# Patient Record
Sex: Male | Born: 1960 | Hispanic: Yes | Marital: Married | Smoking: Never smoker
Health system: Southern US, Community
[De-identification: ages and names within clinical notes are randomized; demographics above are authoritative.]

## PROBLEM LIST (undated history)

## (undated) DIAGNOSIS — R7303 Prediabetes: Secondary | ICD-10-CM

## (undated) HISTORY — PX: NO PAST SURGERIES: SHX2092

---

## 2004-07-09 ENCOUNTER — Emergency Department: Payer: Self-pay | Admitting: Emergency Medicine

## 2011-05-12 ENCOUNTER — Ambulatory Visit: Payer: Self-pay | Admitting: Family Medicine

## 2011-06-08 ENCOUNTER — Ambulatory Visit: Payer: Self-pay | Admitting: Internal Medicine

## 2017-03-22 ENCOUNTER — Ambulatory Visit (INDEPENDENT_AMBULATORY_CARE_PROVIDER_SITE_OTHER)
Admission: RE | Admit: 2017-03-22 | Discharge: 2017-03-22 | Disposition: A | Discharge status: Home / Self Care | Payer: BLUE CROSS/BLUE SHIELD | Source: Ambulatory Visit | Attending: Internal Medicine | Admitting: Internal Medicine

## 2017-03-22 ENCOUNTER — Encounter: Payer: Self-pay | Admitting: Internal Medicine

## 2017-03-22 ENCOUNTER — Ambulatory Visit (INDEPENDENT_AMBULATORY_CARE_PROVIDER_SITE_OTHER): Payer: BLUE CROSS/BLUE SHIELD | Admitting: Internal Medicine

## 2017-03-22 VITALS — BP 118/80 | HR 58 | Temp 98.4°F | Wt 164.0 lb

## 2017-03-22 DIAGNOSIS — R0602 Shortness of breath: Secondary | ICD-10-CM

## 2017-03-22 LAB — COMPREHENSIVE METABOLIC PANEL
ALT: 26 U/L (ref 0–53)
AST: 23 U/L (ref 0–37)
Albumin: 3.9 g/dL (ref 3.5–5.2)
Alkaline Phosphatase: 51 U/L (ref 39–117)
BUN: 19 mg/dL (ref 6–23)
CHLORIDE: 107 meq/L (ref 96–112)
CO2: 30 meq/L (ref 19–32)
Calcium: 9.2 mg/dL (ref 8.4–10.5)
Creatinine, Ser: 0.78 mg/dL (ref 0.40–1.50)
GFR: 108.92 mL/min (ref >60.00)
Glucose, Bld: 107 mg/dL — ABNORMAL HIGH (ref 70–99)
POTASSIUM: 4.3 meq/L (ref 3.5–5.1)
Sodium: 140 mEq/L (ref 135–145)
TOTAL PROTEIN: 7.1 g/dL (ref 6.0–8.3)
Total Bilirubin: 0.4 mg/dL (ref 0.2–1.2)

## 2017-03-22 LAB — LIPID PANEL
CHOLESTEROL: 167 mg/dL (ref 0–200)
HDL: 43.8 mg/dL (ref >39.00)
LDL CALC: 108 mg/dL — AB (ref 0–99)
NonHDL: 123.23
TRIGLYCERIDES: 77 mg/dL (ref 0.0–149.0)
Total CHOL/HDL Ratio: 4
VLDL: 15.4 mg/dL (ref 0.0–40.0)

## 2017-03-22 LAB — CBC
HEMATOCRIT: 43.1 % (ref 39.0–52.0)
Hemoglobin: 14.5 g/dL (ref 13.0–17.0)
MCHC: 33.7 g/dL (ref 30.0–36.0)
MCV: 89.1 fl (ref 78.0–100.0)
Platelets: 272 10*3/uL (ref 150.0–400.0)
RBC: 4.84 Mil/uL (ref 4.22–5.81)
RDW: 13 % (ref 11.5–15.5)
WBC: 6.8 10*3/uL (ref 4.0–10.5)

## 2017-03-22 NOTE — Patient Instructions (Signed)
Falta de aire (Shortness of Breath) Falta de aire significa que tiene dificultad para respirar. Es necesario que reciba atencin mdica de inmediato. CUIDADOS EN EL HOGAR  No fume.  Evite estar cerca de sustancias qumicas (vapores de pintura, polvo) que puedan dificultar su respiracin.  Descanse todo lo que sea necesario. Retome lentamente sus actividades normales.  Tome solo los medicamentos segn le haya indicado el mdico.  Cumpla con las visitas al mdico segn las indicaciones.  SOLICITE AYUDA DE INMEDIATO SI:  La falta de aire empeora.  Tiene mareos, pierde el conocimiento (se desmaya) o tiene tos que no mejora con medicamentos.  Tose y escupe sangre.  Siente dolor al respirar.  Tiene dolor en el pecho, los brazos, los hombros o el vientre (abdomen).  Tiene fiebre.  No puede subir escaleras o realizar ejercicio del modo en que lo haca habitualmente.  No mejora como se esperaba.  Le cuesta hacer las actividades normales, aun si ha descansado lo suficiente.  Tiene problemas con los medicamentos.  Aparece algn sntoma nuevo.  ASEGRESE DE QUE:  Comprende estas instrucciones.  Controlar su afeccin.  Recibir ayuda de inmediato si no mejora o si empeora.  Esta informacin no tiene Theme park manager el consejo del mdico. Asegrese de hacerle al mdico cualquier pregunta que tenga. Document Released: 12/30/2009 Document Revised: 07/17/2013 Document Reviewed: 12/18/2015 Elsevier Interactive Patient Education  2017 [employer].

## 2017-03-22 NOTE — Progress Notes (Signed)
HPI  Pt presents to the clinic today to establish care. He has not had a PCP in many years.  He is concerned about shortness of breath. He reports this started about 3 weeks ago. He is mildly SOB at rest but it is worse with exertion. He has had a slight cough, productive of yellow mucous. He denies runny nose, nasal congestion, sore throat, fever, chills or body aches. He denies chest pain, chest tightness or reflux. He has not taken anything OTC for his symptoms. He does not smoke.   No past medical history on file.  No current outpatient prescriptions on file.   No current facility-administered medications for this visit.     No Known Allergies  Family History  Problem Relation Age of Onset  . Diabetes Father   . Heart disease Father   . Hyperlipidemia Father   . Hypertension Father   . Kidney disease Father     Social History   Social History  . Marital status: Married    Spouse name: N/A  . Number of children: N/A  . Years of education: N/A   Occupational History  . Not on file.   Social History Main Topics  . Smoking status: Never Smoker  . Smokeless tobacco: Never Used  . Alcohol use No  . Drug use: No  . Sexual activity: Not on file   Other Topics Concern  . Not on file   Social History Narrative  . No narrative on file    ROS:  Constitutional: Denies fever, malaise, fatigue, headache or abrupt weight changes.  HEENT: Denies eye pain, eye redness, ear pain, ringing in the ears, wax buildup, runny nose, nasal congestion, bloody nose, or sore throat. Respiratory: Pt reports cough, shortness of breath. Denies difficulty breathing.   Cardiovascular: Denies chest pain, chest tightness, palpitations or swelling in the hands or feet.  Gastrointestinal: Denies abdominal pain, bloating, constipation, diarrhea or blood in the stool.  GU: Denies frequency, urgency, pain with urination, blood in urine, odor or discharge. Musculoskeletal: Denies decrease in range  of motion, difficulty with gait, muscle pain or joint pain and swelling.  Skin: Denies redness, rashes, lesions or ulcercations.  Neurological: Denies dizziness, difficulty with memory, difficulty with speech or problems with balance and coordination.  Psych: Denies anxiety, depression, SI/HI.  No other specific complaints in a complete review of systems (except as listed in HPI above).  PE:  BP 118/80   Pulse (!) 58   Temp 98.4 F (36.9 C) (Oral)   Wt 164 lb (74.4 kg)   SpO2 98%   Wt Readings from Last 3 Encounters:  No data found for Wt    General: Appears his stated age, well developed, well nourished in NAD. HEENT: Throat/Mouth: Teeth present, mucosa pink and moist, no lesions or ulcerations noted.  Neck: Neck supple, trachea midline. No masses, lumps or thyromegaly present.  Cardiovascular: Normal rate and rhythm. S1,S2 noted.  No murmur, rubs or gallops noted.  Pulmonary/Chest: Normal effort and positive vesicular breath sounds. No respiratory distress. No wheezes, rales or ronchi noted.  Musculoskeletal: No difficulty with gait.  Neurological: Alert and oriented.  Psychiatric: Mood and affect normal. Behavior is normal. Judgment and thought content normal.   Assessment and Plan:  Shortness of Breath:  Exam benign Will check CBC, CMET, Lipid profile Chest xray today If labs normal, consider referral to cardiology for stress test  Will follow up after labs, return precautions discussed Nicki Reaper, NP

## 2017-09-02 DIAGNOSIS — R739 Hyperglycemia, unspecified: Secondary | ICD-10-CM | POA: Diagnosis not present

## 2017-09-02 DIAGNOSIS — R202 Paresthesia of skin: Secondary | ICD-10-CM | POA: Diagnosis not present

## 2017-09-02 DIAGNOSIS — Z0000 Encounter for general adult medical examination without abnormal findings: Secondary | ICD-10-CM | POA: Diagnosis not present

## 2017-09-02 DIAGNOSIS — R079 Chest pain, unspecified: Secondary | ICD-10-CM | POA: Diagnosis not present

## 2017-09-02 DIAGNOSIS — Z125 Encounter for screening for malignant neoplasm of prostate: Secondary | ICD-10-CM | POA: Diagnosis not present

## 2017-09-16 DIAGNOSIS — R079 Chest pain, unspecified: Secondary | ICD-10-CM | POA: Diagnosis not present

## 2017-10-25 DIAGNOSIS — Z1211 Encounter for screening for malignant neoplasm of colon: Secondary | ICD-10-CM | POA: Diagnosis not present

## 2017-12-30 ENCOUNTER — Other Ambulatory Visit: Payer: Self-pay

## 2017-12-30 ENCOUNTER — Ambulatory Visit: Payer: BLUE CROSS/BLUE SHIELD | Admitting: Certified Registered Nurse Anesthetist

## 2017-12-30 ENCOUNTER — Encounter
Admission: RE | Disposition: A | Discharge status: Home / Self Care | Payer: Self-pay | Source: Ambulatory Visit | Attending: Gastroenterology

## 2017-12-30 ENCOUNTER — Ambulatory Visit: Admit: 2017-12-30 | Payer: BLUE CROSS/BLUE SHIELD | Admitting: Gastroenterology

## 2017-12-30 ENCOUNTER — Ambulatory Visit
Admission: RE | Admit: 2017-12-30 | Discharge: 2017-12-30 | Disposition: A | Discharge status: Home / Self Care | Payer: BLUE CROSS/BLUE SHIELD | Source: Ambulatory Visit | Attending: Gastroenterology | Admitting: Gastroenterology

## 2017-12-30 DIAGNOSIS — R7303 Prediabetes: Secondary | ICD-10-CM | POA: Diagnosis not present

## 2017-12-30 DIAGNOSIS — Z1211 Encounter for screening for malignant neoplasm of colon: Secondary | ICD-10-CM | POA: Insufficient documentation

## 2017-12-30 HISTORY — PX: COLONOSCOPY WITH PROPOFOL: SHX5780

## 2017-12-30 HISTORY — DX: Prediabetes: R73.03

## 2017-12-30 LAB — GLUCOSE, CAPILLARY: Glucose-Capillary: 75 mg/dL (ref 65–99)

## 2017-12-30 SURGERY — COLONOSCOPY WITH PROPOFOL
Anesthesia: General

## 2017-12-30 MED ORDER — PROPOFOL 10 MG/ML IV BOLUS
INTRAVENOUS | Status: AC
  Filled 2017-12-30: qty 20

## 2017-12-30 MED ORDER — PROPOFOL 500 MG/50ML IV EMUL
INTRAVENOUS | Status: AC
  Filled 2017-12-30: qty 50

## 2017-12-30 MED ORDER — LIDOCAINE HCL (CARDIAC) PF 100 MG/5ML IV SOSY
PREFILLED_SYRINGE | INTRAVENOUS | Status: DC | PRN
  Administered 2017-12-30: 50 mg via INTRAVENOUS

## 2017-12-30 MED ORDER — SODIUM CHLORIDE 0.9 % IV SOLN
INTRAVENOUS | Status: DC
  Administered 2017-12-30 (×2): via INTRAVENOUS

## 2017-12-30 MED ORDER — GLYCOPYRROLATE 0.2 MG/ML IJ SOLN
INTRAMUSCULAR | Status: DC | PRN
  Administered 2017-12-30: 0.2 mg via INTRAVENOUS

## 2017-12-30 MED ORDER — PROPOFOL 10 MG/ML IV BOLUS
INTRAVENOUS | Status: DC | PRN
  Administered 2017-12-30: 40 mg via INTRAVENOUS

## 2017-12-30 MED ORDER — PROPOFOL 500 MG/50ML IV EMUL
INTRAVENOUS | Status: DC | PRN
  Administered 2017-12-30: 75 ug/kg/min via INTRAVENOUS

## 2017-12-30 NOTE — H&P (Signed)
Outpatient short stay form Pre-procedure 12/30/2017 7:11 AM Christena DeemMartin U Skulskie MD  Primary Physician: Leotis ShamesJasmine Singh, MD  Reason for visit: Colonoscopy  History of present illness: Patient is a 57 year old male presenting today for screening colonoscopy.  He tolerated his prep well.  He takes no aspirin or blood thinning agent.  He has no problems with abdominal pain diarrhea or blood in the stools.  Is his first colonoscopy.    Current Facility-Administered Medications:  .  0.9 %  sodium chloride infusion, , Intravenous, Continuous, Christena DeemSkulskie, Martin U, MD  No medications prior to admission.     No Known Allergies   Past Medical History:  Diagnosis Date  . Pre-diabetes     Review of systems:      Physical Exam    Heart and lungs: Without rub or gallop, lungs are bilaterally clear.    HEENT: Normocephalic atraumatic eyes are anicteric    Other:    Pertinant exam for procedure: Soft nontender nondistended bowel sounds positive normoactive.    Planned proceedures: Colonoscopy and indicated procedures.     Christena DeemMartin U Skulskie, MD Gastroenterology 12/30/2017  7:11 AM

## 2017-12-30 NOTE — Op Note (Signed)
[facility] Patient Name: Maxwell Martinez Procedure Date: 12/30/2017 7:26 AM MRN: [id number] Account #: [id number] Date of Birth: Mar 16, 1961 Admit Type: Outpatient Age: 57 Room: [facility] ENDO ROOM 3 Gender: Male Note Status: Finalized Procedure:            Colonoscopy Indications:          Screening for colorectal malignant neoplasm, This is                        the patient's first colonoscopy Providers:            Christena Deem, MD Referring MD:         Lorre Munroe (Referring MD) Medicines:            Monitored Anesthesia Care Complications:        No immediate complications. Procedure:            Pre-Anesthesia Assessment:                       - ASA Grade Assessment: I - A normal, healthy patient.                       After obtaining informed consent, the colonoscope was                        passed under direct vision. Throughout the procedure,                        the patient's blood pressure, pulse, and oxygen                        saturations were monitored continuously. The Olympus                        PCF-H180AL colonoscope ( S#: [id number] ) was introduced                        through the anus and advanced to the the cecum,                        identified by appendiceal orifice and ileocecal valve.                        The colonoscopy was performed without difficulty. The                        patient tolerated the procedure well. The quality of                        the bowel preparation was good. Findings:      The colon (entire examined portion) appeared normal.      The retroflexed view of the distal rectum and anal verge was normal and       showed no anal or rectal abnormalities.      The digital rectal exam was normal. Impression:           - The entire examined colon is normal.                       - The distal rectum and anal verge  are normal on                        retroflexion view.      - No specimens collected. Recommendation:       - Discharge patient to home.                       - Repeat colonoscopy in 10 years for screening purposes. Procedure Code(s):    --- Professional ---                       [phone], Colonoscopy, flexible; diagnostic, including                        collection of specimen(s) by brushing or washing, when                        performed (separate procedure) Diagnosis Code(s):    --- Professional ---                       Z12.11, Encounter for screening for malignant neoplasm                        of colon CPT copyright 2017 American Medical Association. All rights reserved. The codes documented in this report are preliminary and upon coder review may  be revised to meet current compliance requirements. Christena DeemMartin U Avonna Iribe, MD 12/30/2017 8:00:52 AM This report has been signed electronically. Number of Addenda: 0 Note Initiated On: 12/30/2017 7:26 AM Scope Withdrawal Time: 0 hours 8 minutes 42 seconds  Total Procedure Duration: 0 hours 19 minutes 0 seconds       [facility]

## 2017-12-30 NOTE — Anesthesia Preprocedure Evaluation (Signed)
Anesthesia Evaluation  Patient identified by MRN, date of birth, ID band Patient awake    Reviewed: Allergy & Precautions, H&P , NPO status , Patient's Chart, lab work & pertinent test results, reviewed documented beta blocker date and time   History of Anesthesia Complications Negative for: history of anesthetic complications  Airway Mallampati: III  TM Distance: >3 FB Neck ROM: full    Dental  (+) Dental Advidsory Given, Poor Dentition, Missing, Chipped   Pulmonary neg pulmonary ROS,           Cardiovascular Exercise Tolerance: Good negative cardio ROS       Neuro/Psych negative neurological ROS  negative psych ROS   GI/Hepatic negative GI ROS, Neg liver ROS,   Endo/Other  negative endocrine ROS  Renal/GU negative Renal ROS  negative genitourinary   Musculoskeletal   Abdominal   Peds  Hematology negative hematology ROS (+)   Anesthesia Other Findings Past Medical History: No date: Pre-diabetes   Reproductive/Obstetrics negative OB ROS                             Anesthesia Physical Anesthesia Plan  ASA: I  Anesthesia Plan: General   Post-op Pain Management:    Induction: Intravenous  PONV Risk Score and Plan: 2 and Propofol infusion  Airway Management Planned: Nasal Cannula  Additional Equipment:   Intra-op Plan:   Post-operative Plan:   Informed Consent: I have reviewed the patients History and Physical, chart, labs and discussed the procedure including the risks, benefits and alternatives for the proposed anesthesia with the patient or authorized representative who has indicated his/her understanding and acceptance.   Dental Advisory Given  Plan Discussed with: Anesthesiologist, CRNA and Surgeon  Anesthesia Plan Comments:         Anesthesia Quick Evaluation

## 2017-12-30 NOTE — OR Nursing (Signed)
Maxwell Martinez here to spanish interpret for pt.

## 2017-12-30 NOTE — Anesthesia Post-op Follow-up Note (Signed)
Anesthesia QCDR form completed.        

## 2017-12-30 NOTE — Transfer of Care (Signed)
Immediate Anesthesia Transfer of Care Note  Patient: Maxwell Martinez  Procedure(s) Performed: COLONOSCOPY WITH PROPOFOL (N/A )  Patient Location: PACU  Anesthesia Type:MAC  Level of Consciousness: awake, alert  and oriented  Airway & Oxygen Therapy: Patient Spontanous Breathing and Patient connected to nasal cannula oxygen  Post-op Assessment: Report given to RN and Post -op Vital signs reviewed and stable  Post vital signs: stable  Last Vitals:  Vitals Value Taken Time  BP 129/113 12/30/2017  8:02 AM  Temp    Pulse 50 12/30/2017  8:02 AM  Resp 14 12/30/2017  8:02 AM  SpO2 100 % 12/30/2017  8:02 AM  Vitals shown include unvalidated device data.  Last Pain:  Vitals:   12/30/17 0721  PainSc: 0-No pain         Complications: No apparent anesthesia complications

## 2018-01-02 ENCOUNTER — Encounter: Payer: Self-pay | Admitting: Gastroenterology

## 2018-01-02 NOTE — Anesthesia Postprocedure Evaluation (Signed)
Anesthesia Post Note  Patient: Maxwell Martinez  Procedure(s) Performed: COLONOSCOPY WITH PROPOFOL (N/A )  Patient location during evaluation: Endoscopy Anesthesia Type: General Level of consciousness: awake and alert Pain management: pain level controlled Vital Signs Assessment: post-procedure vital signs reviewed and stable Respiratory status: spontaneous breathing, nonlabored ventilation, respiratory function stable and patient connected to nasal cannula oxygen Cardiovascular status: blood pressure returned to baseline and stable Postop Assessment: no apparent nausea or vomiting Anesthetic complications: no     Last Vitals:  Vitals:   12/30/17 0802 12/30/17 0812  BP: (!) 90/56 100/63  Pulse: (!) 51   Resp: 14   Temp: (!) 36.1 C   SpO2: 100%     Last Pain:  Vitals:   12/31/17 1107  TempSrc:   PainSc: 0-No pain                 Lenard SimmerAndrew Shyia Fillingim

## 2018-09-04 DIAGNOSIS — Z1159 Encounter for screening for other viral diseases: Secondary | ICD-10-CM | POA: Diagnosis not present

## 2018-09-04 DIAGNOSIS — R634 Abnormal weight loss: Secondary | ICD-10-CM | POA: Diagnosis not present

## 2018-09-04 DIAGNOSIS — Z0000 Encounter for general adult medical examination without abnormal findings: Secondary | ICD-10-CM | POA: Diagnosis not present

## 2018-09-04 DIAGNOSIS — Z125 Encounter for screening for malignant neoplasm of prostate: Secondary | ICD-10-CM | POA: Diagnosis not present

## 2018-09-04 DIAGNOSIS — R739 Hyperglycemia, unspecified: Secondary | ICD-10-CM | POA: Diagnosis not present

## 2018-10-02 DIAGNOSIS — R7303 Prediabetes: Secondary | ICD-10-CM | POA: Diagnosis not present

## 2018-10-06 IMAGING — DX DG CHEST 2V
2 series · 2 of 2 positions shown · non-contrast
Comparison: None

CLINICAL DATA: Shortness of breath

EXAM:
CHEST  2 VIEW

[chest pa]
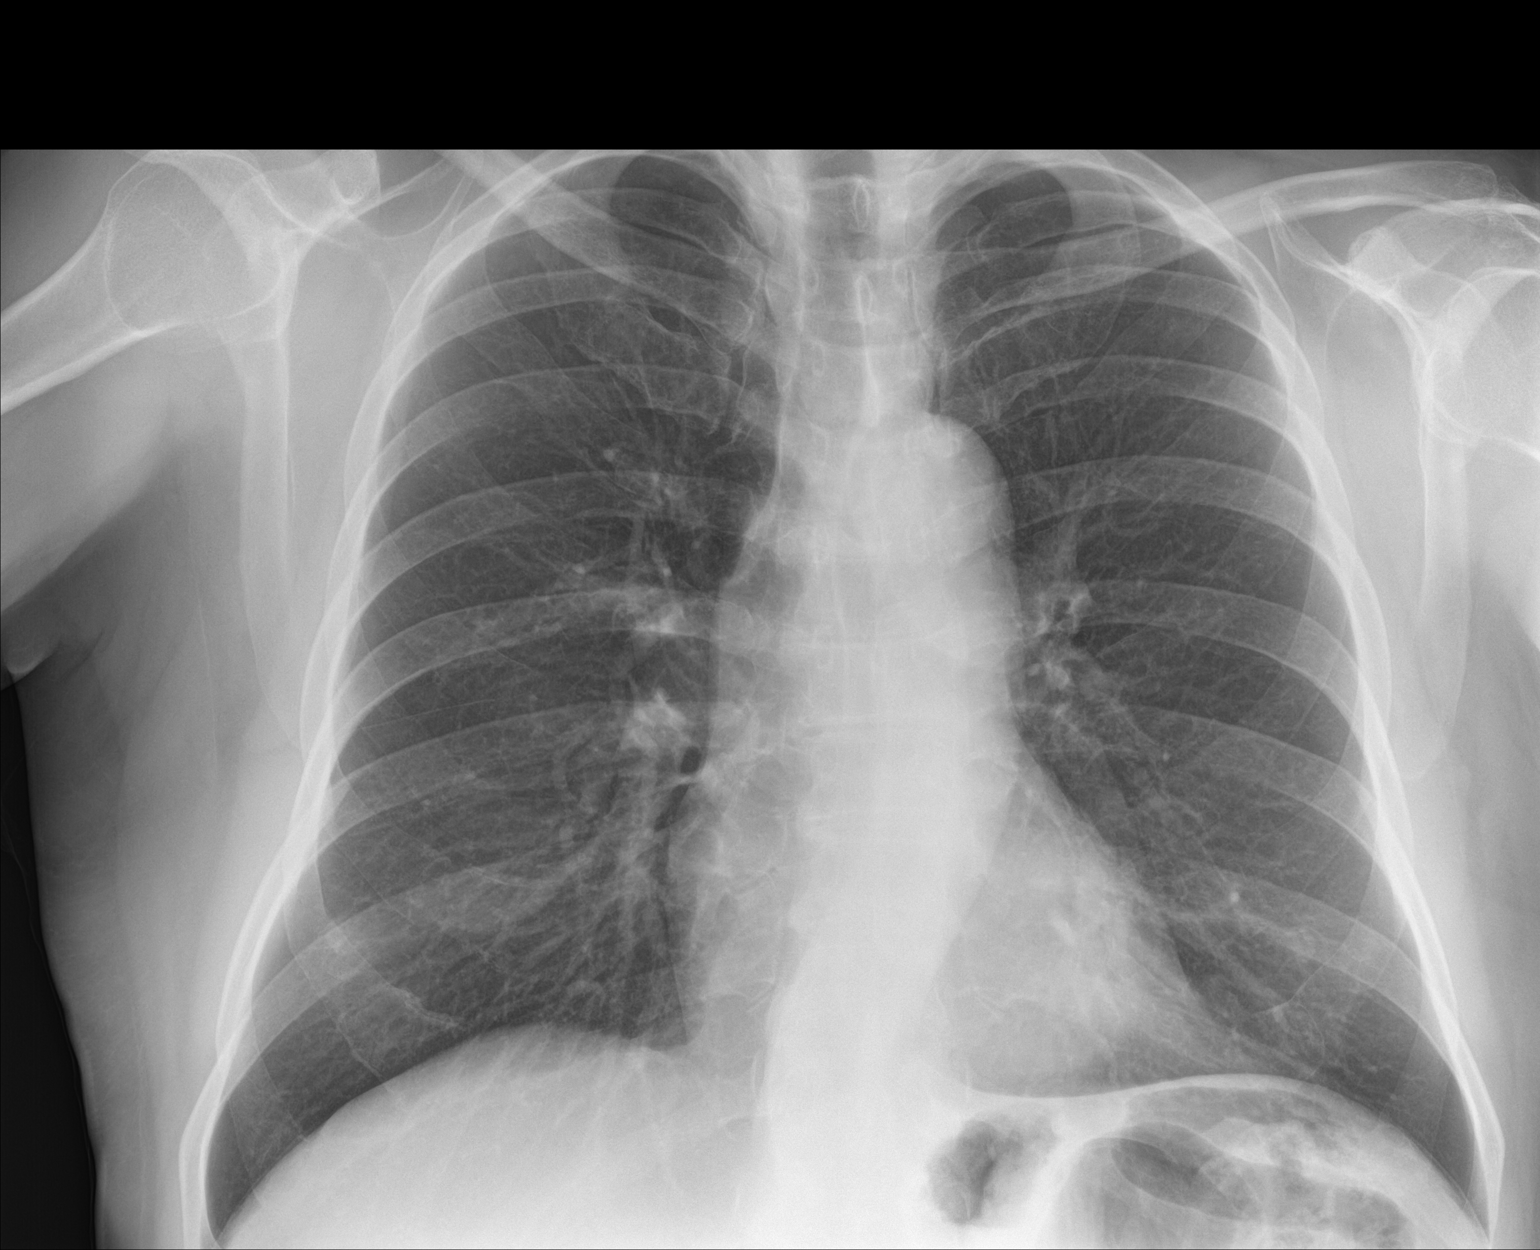

[chest lat]
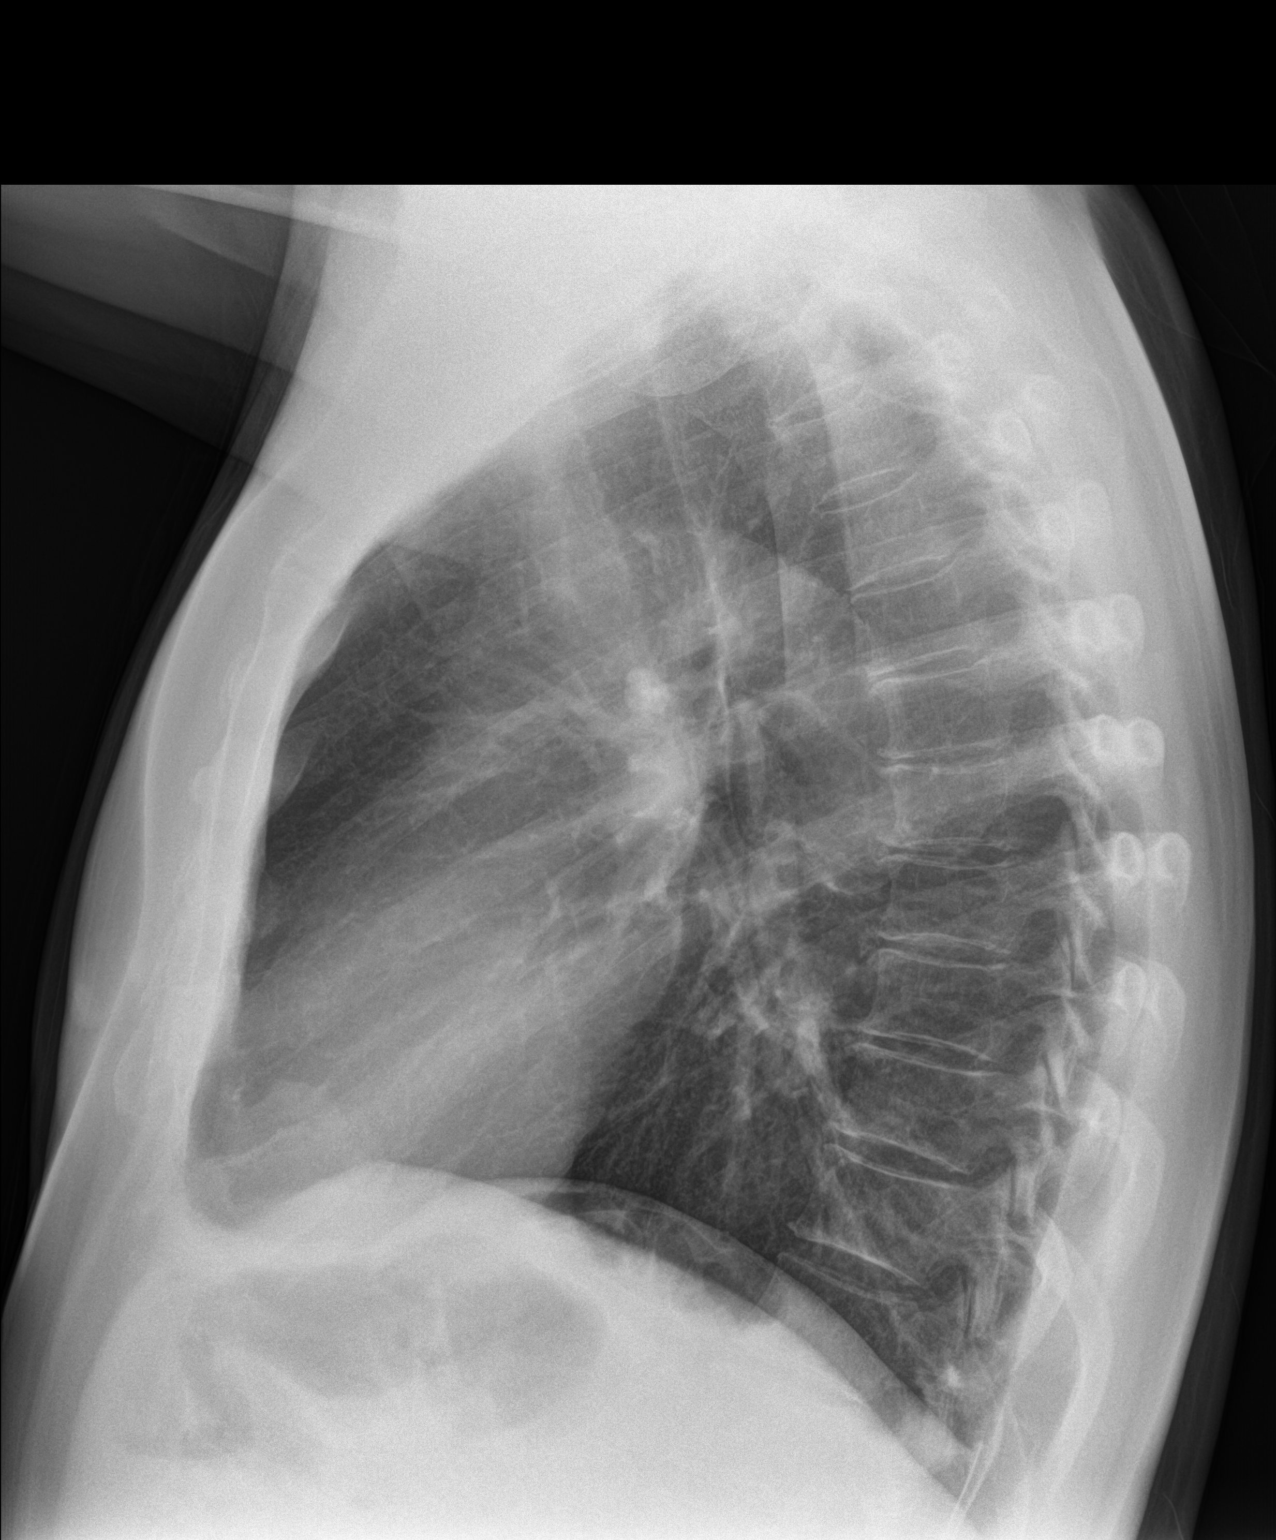

[2 of 2 positions shown; findings below may reference images not displayed]

FINDINGS: Normal heart size, mediastinal contours, and pulmonary vascularity.

Lungs clear.

No pulmonary infiltrate, pleural effusion or pneumothorax.

BILATERAL nipple shadows.

No acute osseous findings.
IMPRESSION: No acute abnormalities.

BILATERAL nipple shadows.

## 2018-12-01 DIAGNOSIS — R739 Hyperglycemia, unspecified: Secondary | ICD-10-CM | POA: Diagnosis not present

## 2018-12-01 DIAGNOSIS — R079 Chest pain, unspecified: Secondary | ICD-10-CM | POA: Diagnosis not present

## 2018-12-01 DIAGNOSIS — R202 Paresthesia of skin: Secondary | ICD-10-CM | POA: Diagnosis not present

## 2018-12-29 DIAGNOSIS — R079 Chest pain, unspecified: Secondary | ICD-10-CM | POA: Diagnosis not present

## 2019-04-03 DIAGNOSIS — Z23 Encounter for immunization: Secondary | ICD-10-CM | POA: Diagnosis not present

## 2019-05-28 DIAGNOSIS — R202 Paresthesia of skin: Secondary | ICD-10-CM | POA: Diagnosis not present

## 2019-05-28 DIAGNOSIS — R739 Hyperglycemia, unspecified: Secondary | ICD-10-CM | POA: Diagnosis not present

## 2019-05-28 DIAGNOSIS — R079 Chest pain, unspecified: Secondary | ICD-10-CM | POA: Diagnosis not present

## 2019-06-04 DIAGNOSIS — I340 Nonrheumatic mitral (valve) insufficiency: Secondary | ICD-10-CM | POA: Diagnosis not present

## 2019-06-04 DIAGNOSIS — R0600 Dyspnea, unspecified: Secondary | ICD-10-CM | POA: Diagnosis not present

## 2019-06-04 DIAGNOSIS — R739 Hyperglycemia, unspecified: Secondary | ICD-10-CM | POA: Diagnosis not present

## 2019-06-04 DIAGNOSIS — R0609 Other forms of dyspnea: Secondary | ICD-10-CM | POA: Diagnosis not present

## 2019-06-04 DIAGNOSIS — R972 Elevated prostate specific antigen [PSA]: Secondary | ICD-10-CM | POA: Diagnosis not present

## 2019-06-11 DIAGNOSIS — R0600 Dyspnea, unspecified: Secondary | ICD-10-CM | POA: Diagnosis not present

## 2019-06-11 DIAGNOSIS — R0609 Other forms of dyspnea: Secondary | ICD-10-CM | POA: Diagnosis not present

## 2019-10-21 ENCOUNTER — Ambulatory Visit: Payer: Self-pay | Attending: Internal Medicine

## 2019-10-21 DIAGNOSIS — Z23 Encounter for immunization: Secondary | ICD-10-CM

## 2019-10-21 NOTE — Progress Notes (Signed)
   Covid-19 Vaccination Clinic  Name:  Maxwell Martinez    MRN: [id number] DOB: 06/06/1961  10/21/2019  Mr. Maxwell Martinez was observed post Covid-19 immunization for 15 minutes without incident. He was provided with Vaccine Information Sheet and instruction to access the V-Safe system.   Mr. Maxwell Martinez was instructed to call 911 with any severe reactions post vaccine: Marland Kitchen Difficulty breathing  . Swelling of face and throat  . A fast heartbeat  . A bad rash all over body  . Dizziness and weakness   Immunizations Administered    Name Date Dose VIS Date Route   Pfizer COVID-19 Vaccine 10/21/2019 11:03 AM 0.3 mL 07/06/2019 Intramuscular   Manufacturer: [employer], [employer]   Lot: [id number]   NDC: [address]-1

## 2019-11-11 ENCOUNTER — Ambulatory Visit: Payer: Self-pay | Attending: Internal Medicine

## 2019-11-11 DIAGNOSIS — Z23 Encounter for immunization: Secondary | ICD-10-CM

## 2019-11-11 NOTE — Progress Notes (Signed)
   Covid-19 Vaccination Clinic  Name:  Kyley Laurel    MRN: [id number] DOB: 1961-06-14  11/11/2019  Mr. Draedyn Weidinger was observed post Covid-19 immunization for 15 minutes without incident. He was provided with Vaccine Information Sheet and instruction to access the V-Safe system.   Mr. Fidel Caggiano was instructed to call 911 with any severe reactions post vaccine: Marland Kitchen Difficulty breathing  . Swelling of face and throat  . A fast heartbeat  . A bad rash all over body  . Dizziness and weakness   Immunizations Administered    Name Date Dose VIS Date Route   Pfizer COVID-19 Vaccine 11/11/2019 10:27 AM 0.3 mL 07/06/2019 Intramuscular   Manufacturer: [employer], [employer]   Lot: [id number]   NDC: [address]-9

## 2020-09-23 ENCOUNTER — Other Ambulatory Visit: Payer: Self-pay | Admitting: Internal Medicine

## 2020-09-23 DIAGNOSIS — R1031 Right lower quadrant pain: Secondary | ICD-10-CM
